# Patient Record
Sex: Female | Born: 1955 | Hispanic: Yes | Marital: Married | Smoking: Never smoker
Health system: Southern US, Community
[De-identification: ages and names within clinical notes are randomized; demographics above are authoritative.]

## PROBLEM LIST (undated history)

## (undated) DIAGNOSIS — N200 Calculus of kidney: Secondary | ICD-10-CM

## (undated) DIAGNOSIS — K259 Gastric ulcer, unspecified as acute or chronic, without hemorrhage or perforation: Secondary | ICD-10-CM

## (undated) DIAGNOSIS — N841 Polyp of cervix uteri: Secondary | ICD-10-CM

## (undated) HISTORY — DX: Gastric ulcer, unspecified as acute or chronic, without hemorrhage or perforation: K25.9

## (undated) HISTORY — DX: Calculus of kidney: N20.0

## (undated) HISTORY — PX: KIDNEY STONE SURGERY: SHX686

## (undated) HISTORY — DX: Polyp of cervix uteri: N84.1

---

## 2013-09-27 ENCOUNTER — Observation Stay: Payer: Self-pay | Admitting: Internal Medicine

## 2013-09-27 DIAGNOSIS — I059 Rheumatic mitral valve disease, unspecified: Secondary | ICD-10-CM

## 2013-09-27 LAB — CBC WITH DIFFERENTIAL/PLATELET
Basophil #: 0.1 10*3/uL (ref 0.0–0.1)
Basophil %: 0.8 %
EOS PCT: 2.9 %
Eosinophil #: 0.3 10*3/uL (ref 0.0–0.7)
HCT: 39.3 % (ref 35.0–47.0)
HGB: 14 g/dL (ref 12.0–16.0)
LYMPHS ABS: 4.8 10*3/uL — AB (ref 1.0–3.6)
Lymphocyte %: 43.7 %
MCH: 33.5 pg (ref 26.0–34.0)
MCHC: 35.7 g/dL (ref 32.0–36.0)
MCV: 94 fL (ref 80–100)
Monocyte #: 1 x10 3/mm — ABNORMAL HIGH (ref 0.2–0.9)
Monocyte %: 8.9 %
NEUTROS ABS: 4.8 10*3/uL (ref 1.4–6.5)
Neutrophil %: 43.7 %
PLATELETS: 257 10*3/uL (ref 150–440)
RBC: 4.19 10*6/uL (ref 3.80–5.20)
RDW: 13.2 % (ref 11.5–14.5)
WBC: 11.1 10*3/uL — ABNORMAL HIGH (ref 3.6–11.0)

## 2013-09-27 LAB — DRUG SCREEN, URINE

## 2013-09-27 LAB — COMPREHENSIVE METABOLIC PANEL
ALBUMIN: 3.5 g/dL (ref 3.4–5.0)
ALK PHOS: 100 U/L
ANION GAP: 2 — AB (ref 7–16)
AST: 38 U/L — AB (ref 15–37)
BUN: 30 mg/dL — ABNORMAL HIGH (ref 7–18)
Bilirubin,Total: 0.3 mg/dL (ref 0.2–1.0)
CALCIUM: 8.9 mg/dL (ref 8.5–10.1)
CHLORIDE: 107 mmol/L (ref 98–107)
CREATININE: 0.67 mg/dL (ref 0.60–1.30)
Co2: 29 mmol/L (ref 21–32)
EGFR (Non-African Amer.): >60
Glucose: 129 mg/dL — ABNORMAL HIGH (ref 65–99)
Osmolality: 284 (ref 275–301)
Potassium: 3.8 mmol/L (ref 3.5–5.1)
SGPT (ALT): 46 U/L (ref 12–78)
Sodium: 138 mmol/L (ref 136–145)
TOTAL PROTEIN: 7.4 g/dL (ref 6.4–8.2)

## 2013-09-27 LAB — TROPONIN I
Troponin-I: <0.02 ng/mL
Troponin-I: <0.02 ng/mL
Troponin-I: <0.02 ng/mL

## 2013-09-27 LAB — URINALYSIS, COMPLETE
BACTERIA: NONE SEEN
BILIRUBIN, UR: NEGATIVE
Blood: NEGATIVE
GLUCOSE, UR: NEGATIVE mg/dL (ref 0–75)
KETONE: NEGATIVE
Nitrite: NEGATIVE
PH: 5 (ref 4.5–8.0)
PROTEIN: NEGATIVE
SPECIFIC GRAVITY: 1.023 (ref 1.003–1.030)
Squamous Epithelial: 1
WBC UR: 4 /HPF (ref 0–5)

## 2013-09-27 LAB — CK TOTAL AND CKMB (NOT AT ARMC)
CK, Total: 95 U/L
CK, Total: 91 U/L
CK, Total: 87 U/L
CK-MB: 2.2 ng/mL (ref 0.5–3.6)
CK-MB: 2.1 ng/mL (ref 0.5–3.6)
CK-MB: 1.7 ng/mL (ref 0.5–3.6)

## 2013-09-27 LAB — LIPASE, BLOOD: LIPASE: 123 U/L (ref 73–393)

## 2013-09-28 LAB — LIPID PANEL
Cholesterol: 173 mg/dL (ref 0–200)
HDL: 26 mg/dL — AB (ref 40–60)
Ldl Cholesterol, Calc: 89 mg/dL (ref 0–100)
Triglycerides: 290 mg/dL — ABNORMAL HIGH (ref 0–200)
VLDL CHOLESTEROL, CALC: 58 mg/dL — AB (ref 5–40)

## 2014-12-04 NOTE — Discharge Summary (Signed)
PATIENT NAME:  Beth Copeland, Beth Copeland MR#:  [id number] DATE OF BIRTH:  1955/09/27  DATE OF ADMISSION:  09/27/2013 DATE OF DISCHARGE:  09/28/2013  ADMITTING PHYSICIAN:  Aaron Mose. Hower, MD  DISCHARGING PHYSICIAN: Gladstone Lighter, MD  PRIMARY CARE PHYSICIAN: None.   [place]: None.   DISCHARGE DIAGNOSES: 1.  Syncope, likely vasovagal or panic attack.  2.  Gastroesophageal reflux disease.  3.  Nocturnal cough secondary to gastroesophageal reflux disease.    DISCHARGE HOME MEDICATIONS:  1.  Protonix 40 mg p.o. at bedtime, before meals.  2.  Ranitidine 150 mg p.o. at bedtime.   DISCHARGE DIET:  Low fat diet.   DISCHARGE ACTIVITY: As tolerated.    FOLLOWUP INSTRUCTIONS:  PCP followup in 2 to 3 weeks.   LABORATORY, DIAGNOSTIC AND RADIOLOGICAL DATA PRIOR TO DISCHARGE:  1.  LDL cholesterol 89, HDL 26, triglycerides 290 and total cholesterol 173.  2.  CK, CK-MB, troponin is x 3 are negative.  3.  Ultrasound carotid Dopplers showing minimal amount of intimal wall thickening, no hemodynamically significant cyanosis noted. There is also a 2 cm cystic nodule in left lobe of thyroid.   4.  Echo Doppler showing normal LV ejection fraction, EF of 55% to 60%, impaired relaxation of LV diastolic filling noted.  5.  Urinalysis negative for any infection. Urine tox screen is negative.  6.  CT of the head without contrast showing no acute intracranial abnormalities noted.  7.  CT of the abdomen and pelvis with contrast showing no traumatic injury, chronic renal disease, diverticulosis of the left colon and hepatic cyst which is benign, and cardiomegaly noted.  8.  CT C-spine showing normal noncontrast CT of the C-spine.  9.  WBC 11.1, hemoglobin 14.3, hematocrit 39.3, platelet count 257.  10.  Sodium 138, potassium 3.8, chloride 107, bicarb 29, BUN 30, creatinine 0.67, glucose 129 and calcium of 8.9.  11.  ALT 46, AST 38, alk phos 100, total bili 0.3 and albumin 3.5. Lipase is 123.    HOSPITAL COURSE: Ms. Kaman is a 59 year old Spanish-speaking Hispanic female with no significant past medical history, presents after a syncopal episode.  1.  Syncope/near syncope. The patient suddenly saw a picture of her mom who just passed away, became emotional, hyperventilating and then had a syncopal episode so it is likely secondary to like a panic attack or vasovagal syncope. She was monitored on telemetry, showed only sinus brady with no arrhythmias. Had an echocardiogram which was completely normal and carotid ultrasound which did not show any obstructive lesions. The patient ambulated well without any recurrence of the symptoms and is being discharged home.  2.  Nocturnal cough with gastroesophageal reflux disease. The patient has severe heartburn symptoms and significant nocturnal dry cough, was started on Protonix and Zantac while in the hospital with improvement in symptoms and is being discharged on the same.  3.  Abdominal pain. She has abdominal pain which sounded more muscular, however, a CT of the abdomen and pelvis was done which was negative in the ED. Probably from her constant nocturnal cough that she has muscle spasms and the muscular abdominal is relieved at the time of discharge.   Her course has been otherwise uneventful in the hospital.   DISCHARGE CONDITION: Stable.   DISCHARGE DISPOSITION: Home.  TIME SPENT ON DISCHARGE:  40 minutes.   ____________________________ Gladstone Lighter, MD rk:cs D: 09/28/2013 13:42:29 ET T: 09/28/2013 14:11:11 ET JOB#: [id number]  cc: Gladstone Lighter, MD, <Dictator> Gladstone Lighter MD  ELECTRONICALLY SIGNED 09/29/2013 15:07

## 2014-12-04 NOTE — H&P (Signed)
PATIENT NAME:  Beth Copeland, FRIESENHAHN MR#:  [id number] DATE OF BIRTH:  1955/11/28  DATE OF ADMISSION:  09/26/2013  REFERRING PHYSICIAN:  Dr. Darnelle Catalan.   PRIMARY CARE PHYSICIAN:  Nonlocal.   CHIEF COMPLAINT:  Syncope.   HISTORY OF PRESENT ILLNESS:  A 59 year old Hispanic female without significant past medical history presenting after a syncopal episode.  Apparently, she was looking at a picture of her mother who is deceased, felt dizzy which she describes as the room spinning sensation and then sat down in the bathroom on the bathtub.  She denies any head trauma, though she states that she may have had possible loss of consciousness for a few moments.  She denies any chest pain, palpitations, shortness of breath, anginal symptoms, any preceding symptoms.  She has been in her usual state of health; however, on initial work-up she was found to have lateral T inversions on her EKG, been requested to see her for admission based off of the EKG findings.  The patient herself is currently complaining only of mild dizziness that is significantly improved.   REVIEW OF SYSTEMS:  CONSTITUTIONAL:  Denies fever.  Positive for generalized fatigue and weakness.  EYES:  Denies blurred vision, double vision, eye pain.  EARS, NOSE, THROAT:  Denies tinnitus, ear pain, hearing loss.  RESPIRATORY:  Denies cough, wheeze, shortness of breath.  CARDIOVASCULAR:  Denies chest pain, palpitations, edema. GASTROINTESTINAL:  Denies nausea, vomiting, diarrhea, abdominal pain.  GENITOURINARY:  Denies dysuria or hematuria.  ENDOCRINE:  Denies nocturia or thyroid problems.  HEMATOLOGY AND LYMPHATIC:  Denies easy bruising, bleeding.  SKIN:  Denies rash or lesion.  MUSCULOSKELETAL:  Denies pain in neck, back, shoulder, knees, hips or arthritic symptoms.  NEUROLOGIC:  Denies paralysis, paresthesias or headache.  Positive for dizziness described above.   PSYCHIATRIC:  Positive for depressive symptoms.  Denies any anxiety.    PAST MEDICAL  HISTORY:  No significant medical issues.   SOCIAL HISTORY:  Denies alcohol, tobacco or drug usage.   FAMILY HISTORY:  Denies any sudden cardiac death or cardiovascular diseases.   ALLERGIES:  No known drug allergies.   HOME MEDICATIONS:  None.   PHYSICAL EXAMINATION: VITAL SIGNS:  Temperature 98.4, heart rate 58, respirations 18, blood pressure 138/76, saturating 97% on room air.  Weight 68 kg.  GENERAL:  Well-nourished, well-developed Hispanic female who is currently in no acute distress.  HEAD:  Normocephalic, atraumatic.  EYES:  Pupils equal, round, reactive to light.  Extraocular muscles intact.  No scleral icterus.  MOUTH:  Moist mucosal membranes.  Dentition intact.  No abscess noted.  EAR, NOSE, THROAT:  Throat clear without exudates.  No external lesions.  NECK:  Supple.  No thyromegaly.  No nodules.  No JVD.  PULMONARY:  Clear to auscultation bilaterally without wheezes, rubs or rhonchi.  No use of accessory muscles.  Good respiratory rate.  CHEST:  Nontender to palpation.  CARDIOVASCULAR:  S1, S2, regular rate and rhythm.  No murmurs, rubs, or gallops.  No edema.  Pedal pulses 2+ bilaterally.  GASTROINTESTINAL:  Soft, nontender, nondistended.  No masses.  Positive bowel sounds.  No hepatosplenomegaly.  MUSCULOSKELETAL:  No swelling, clubbing, edema.  Range of motion full in all extremities.  NEUROLOGIC:  Cranial nerves II through XII intact.  No gross focal neurological deficits.  Sensation intact.  Reflexes intact.  SKIN:  No ulcerations, lesions, rashes, cyanosis.  Skin warm, dry.  Turgor intact.  PSYCHIATRIC:  Mood and affect appears depressed.  She is awake, alert,  oriented x 3, though responses are somewhat slow.  Insight and judgment are intact.   LABORATORY DATA:  EKG performed revealing sinus bradycardia, heart rate 54.  No significant conduction delays, likely left atrial enlargement as well as lateral T inversions.  No old EKG for comparison.  Remainder of laboratory  data, sodium 138, potassium 3.8, chloride 107, bicarb 29, BUN 30, creatinine 0.67, glucose 129.  LFTs within normal limits.  Troponin I less than 0.02.  Urine drug screen within normal limits.  WBC 11.1, hemoglobin 14, platelets 257.  Urinalysis, no evidence of infection.   ASSESSMENT AND PLAN:  A 59 year old Hispanic female without significant past medical history presenting after a brief syncopal.  1.  Syncope.  EKG reveals lateral T inversions.  We will admit to observation.  Trend cardiac enzymes.  Admit to telemetry.  She has no other cardiac history or symptoms.  2.  Leukocytosis.  No evidence of infection.  No need for antibiotics at this time.  3.  VTE prophylaxis with heparin subQ.  4.  CODE STATUS:  THE PATIENT IS A FULL CODE.   TIME SPENT:  45 minutes.    ____________________________ Cletis Athensavid K. Hower, MD dkh:ea D: 09/27/2013 03:00:30 ET T: 09/27/2013 04:18:14 ET JOB#: [id number]  cc: Cletis Athensavid K. Hower, MD, <Dictator> DAVID Synetta ShadowK HOWER MD ELECTRONICALLY SIGNED 09/27/2013 20:24

## 2015-04-23 IMAGING — CT CT HEAD WITHOUT CONTRAST
3 of 5 series · 14 of 47 positions shown, 16 images · non-contrast
Comparison: None.

CLINICAL DATA: 57-year-old female dizziness fall. Neck pain.
Initial encounter.

EXAM:
CT HEAD WITHOUT CONTRAST
CT CERVICAL SPINE WITHOUT CONTRAST
TECHNIQUE: Multidetector CT imaging of the head and cervical spine was
performed following the standard protocol without intravenous
contrast. Multiplanar CT image reconstructions of the cervical spine
were also generated.

[Series 5: soft tissue · axial · 0.29mm/px · z∈[-286,-142]mm · 8 of 88 slices shown, 10 images]
[im 8/88  brain]
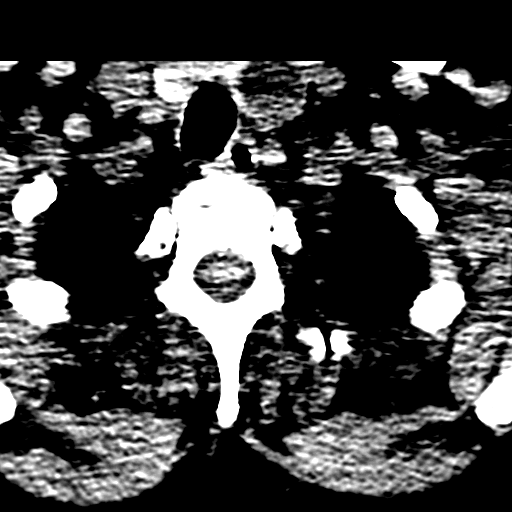
[im 8/88  bone]
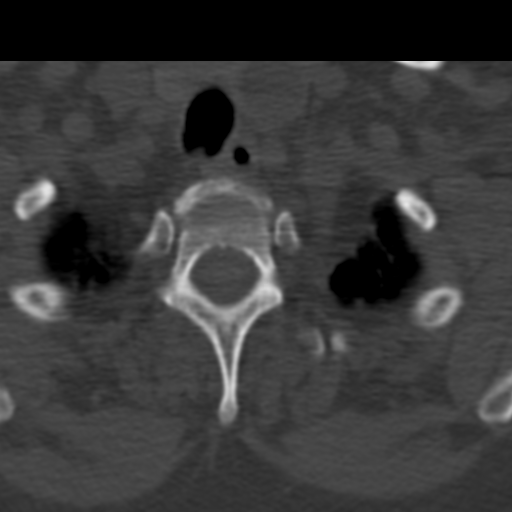
[im 22/88  brain]
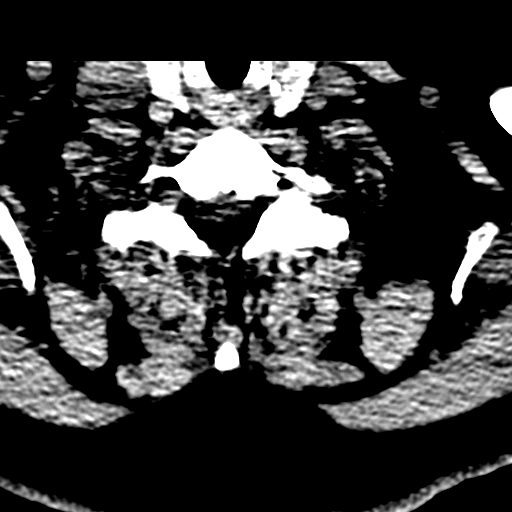
[im 30/88  brain]
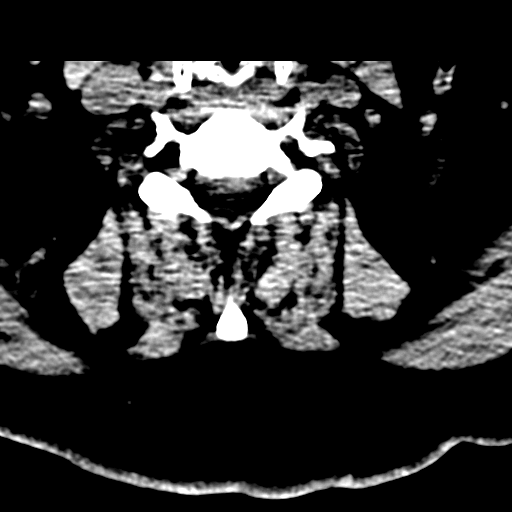
[im 37/88  brain]
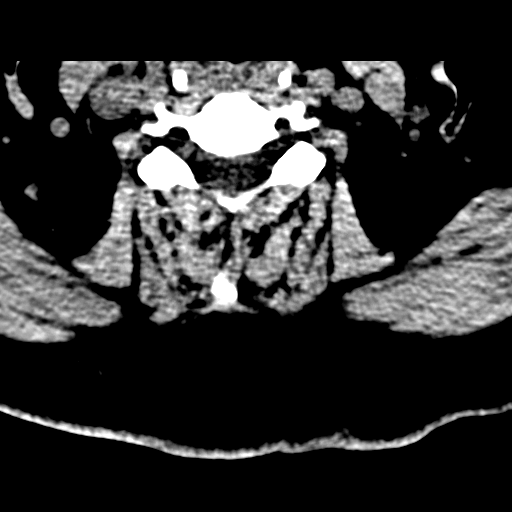
[im 51/88  brain]
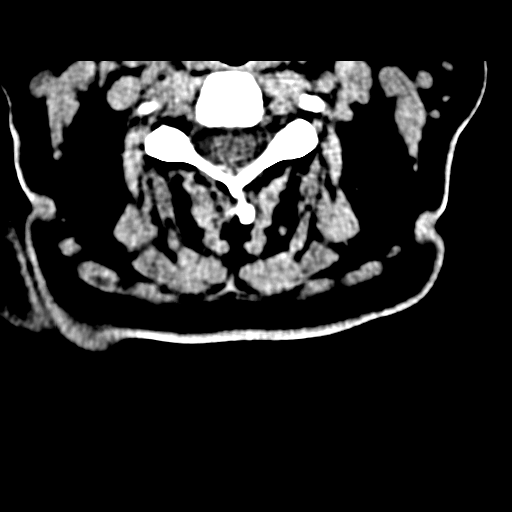
[im 51/88  bone]
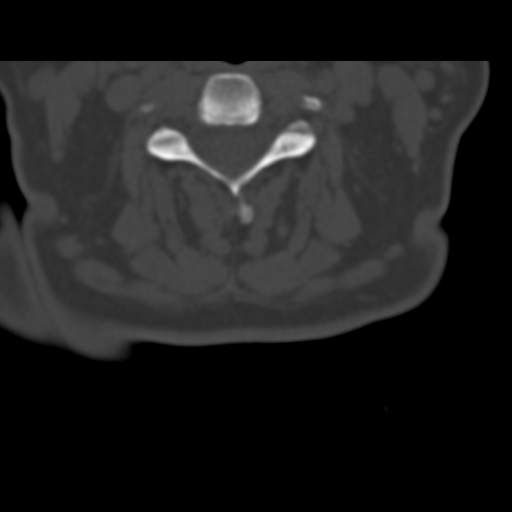
[im 59/88  brain]
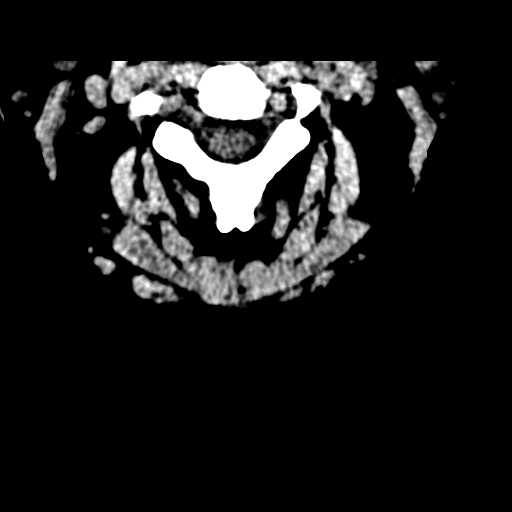
[im 66/88  brain]
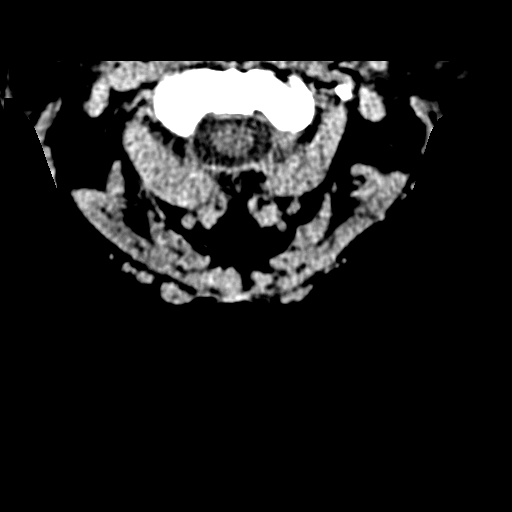
[im 80/88  brain]
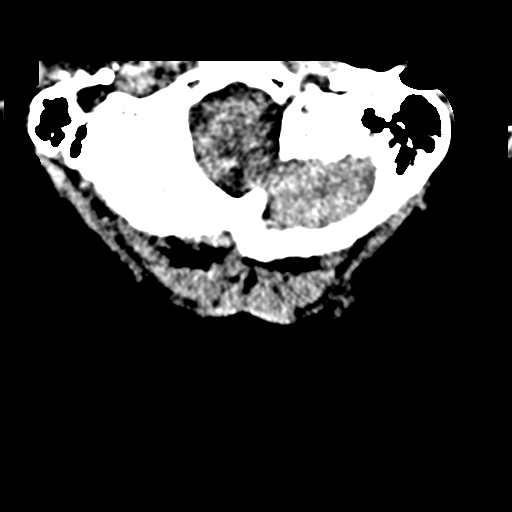

[Series 8: sagittal bone · sagittal · 0.21mm/px · 3 of 54 slices shown]
[im 18/54  brain]
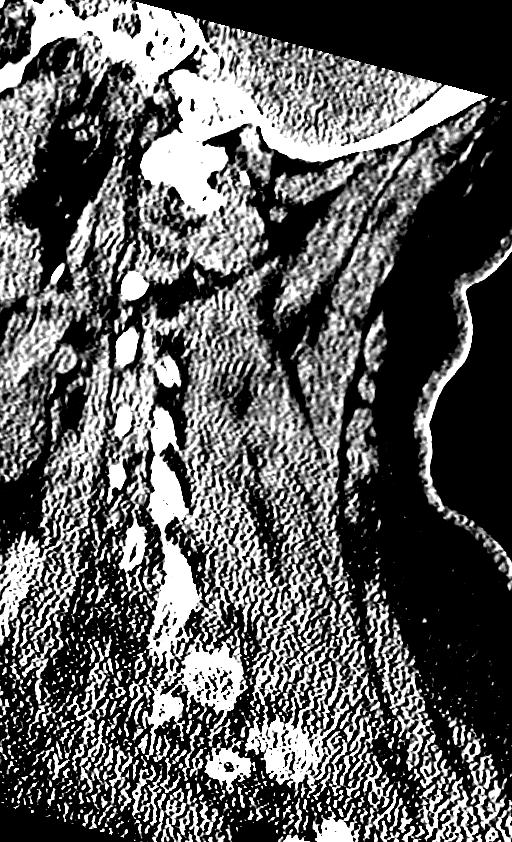
[im 27/54  brain]
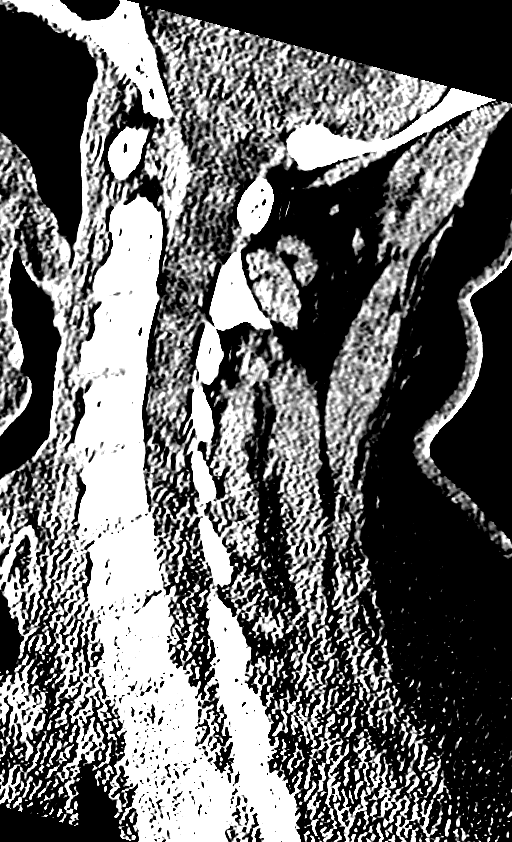
[im 36/54  brain]
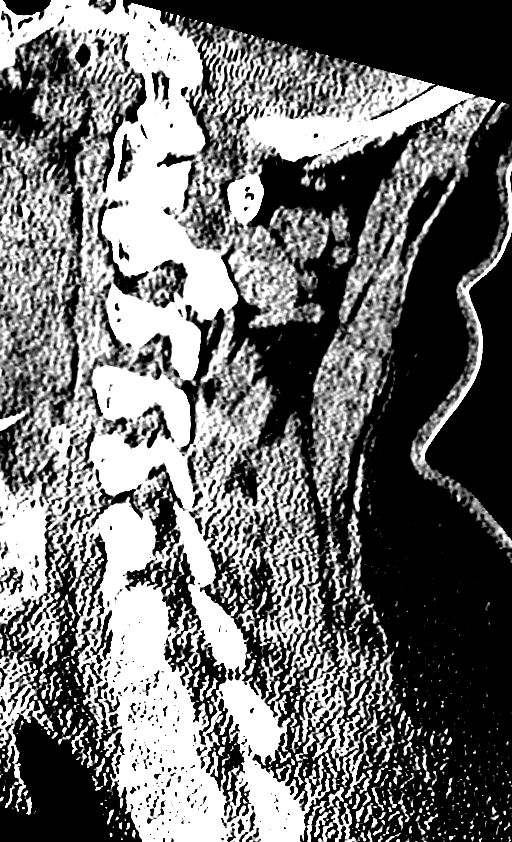

[Series 9: coronal bone · coronal · 0.28mm/px · 3 of 41 slices shown]
[im 14/41  brain]
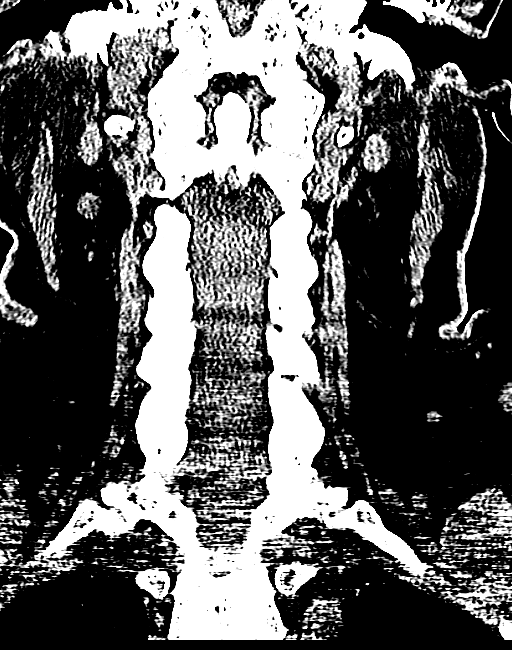
[im 18/41  brain]
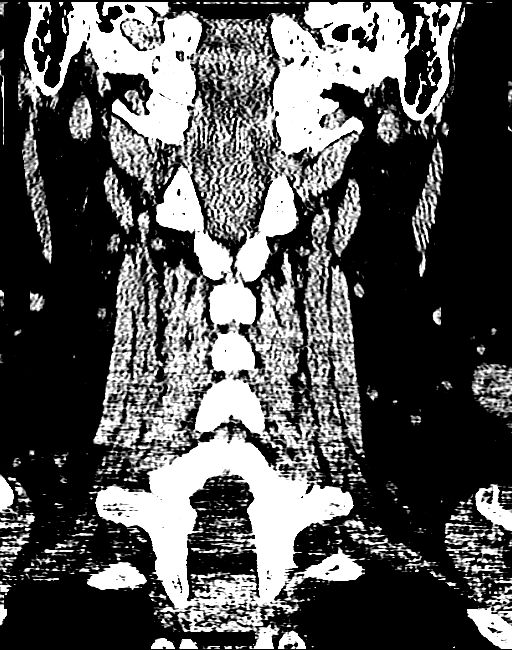
[im 23/41  brain]
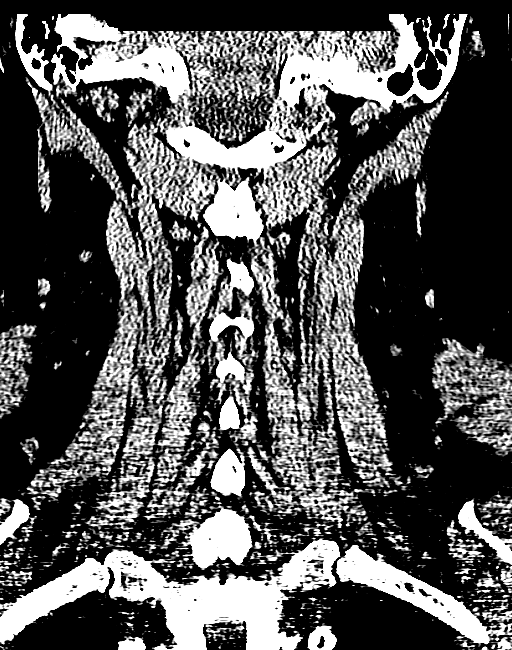

[14 of 47 positions shown; findings below may reference images not displayed]

FINDINGS: CT HEAD FINDINGS

Visualized orbits and scalp soft tissues are within normal limits.
Visualized paranasal sinuses and mastoids are clear. No acute
osseous abnormality identified.

Cerebral volume is normal. No midline shift, ventriculomegaly, mass
effect, evidence of mass lesion, intracranial hemorrhage or evidence
of cortically based acute infarction. Gray-white matter
differentiation is within normal limits throughout the brain.

CT CERVICAL SPINE FINDINGS

Relatively preserved cervical lordosis. Cervicothoracic junction
alignment is within normal limits. Visualized skull base is intact.
No atlanto-occipital dissociation. Bilateral posterior element
alignment is within normal limits. No acute cervical spine fracture.
Grossly intact visible upper thoracic levels. Mild atelectasis in
the lung apices. 2 cm left thyroid low density nodule. Otherwise
negative paraspinal soft tissues.
IMPRESSION: 1.  Normal noncontrast CT appearance of the brain.
2. No acute fracture or listhesis identified in the cervical spine.
Ligamentous injury is not excluded.
3. 2 cm low-density left thyroid nodule, recommend nonemergent
thyroid ultrasound for further characterization.

## 2016-06-13 ENCOUNTER — Other Ambulatory Visit: Payer: Self-pay | Admitting: *Deleted

## 2016-06-13 ENCOUNTER — Ambulatory Visit
Admission: RE | Admit: 2016-06-13 | Discharge: 2016-06-13 | Disposition: A | Discharge status: Home / Self Care | Payer: Self-pay | Source: Ambulatory Visit | Attending: Oncology | Admitting: Oncology

## 2016-06-13 ENCOUNTER — Ambulatory Visit: Payer: Self-pay | Attending: Oncology | Admitting: *Deleted

## 2016-06-13 ENCOUNTER — Encounter: Payer: Self-pay | Admitting: *Deleted

## 2016-06-13 VITALS — BP 153/79 | HR 54 | Temp 97.0°F | Ht 63.78 in | Wt 175.4 lb

## 2016-06-13 DIAGNOSIS — N63 Unspecified lump in unspecified breast: Secondary | ICD-10-CM

## 2016-06-13 DIAGNOSIS — Z0000 Encounter for general adult medical examination without abnormal findings: Secondary | ICD-10-CM

## 2016-06-13 NOTE — Progress Notes (Signed)
Subjective:     Patient ID: Beth Copeland, female   DOB: 1956/08/11, 60 y.o.   MRN: [id number]  HPI   Review of Systems     Objective:   Physical Exam  Pulmonary/Chest: Right breast exhibits no inverted nipple, no mass, no nipple discharge, no skin change and no tenderness. Left breast exhibits no inverted nipple, no mass, no nipple discharge, no skin change and no tenderness. Breasts are symmetrical.  Abdominal: There is no splenomegaly or hepatomegaly.  Genitourinary: No labial fusion. There is no rash, tenderness, lesion or injury on the right labia. There is no rash, tenderness, lesion or injury on the left labia. No erythema, tenderness or bleeding in the vagina. No foreign body in the vagina. No signs of injury around the vagina. No vaginal discharge found.    Genitourinary Comments: Large cystocele noted       Assessment:     60 year old Hispanic [facility], pap and mammogram.  Beth Copeland, the interpreter present during the interview and exam.  Clinical breast exam unremarkable.  Taught self breast awareness.  Specimen collected for pap smear.  On visual inspection there is noted excoriation around the cervical os and 2 polyp like lesions.  Patient has been screened for eligibility.  She does not have any insurance, Medicare or Medicaid.  She also meets financial eligibility.  Hand-out given on the Affordable Care Act.    Plan:     Will get screening mammogram.  This will be the patients baseline mammogram since she has never had one.  Specimen sent to the lab.  Beth Copeland to schedule patient for GYN consult for further evaluation of the cervical polyps.

## 2016-06-13 NOTE — Patient Instructions (Signed)
Human Papillomavirus Human papillomavirus (HPV) is the most common sexually transmitted infection (STI) and is highly contagious. HPV infections cause genital warts and cancers to the outlet of the womb (cervix), birth canal (vagina), opening of the birth canal (vulva), and anus. There are over 100 types of HPV. Unless wartlike lesions are present in the throat or there are genital warts that you can see or feel, HPV usually does not cause symptoms. It is possible to be infected for long periods and pass it on to others without knowing it. CAUSES  HPV is spread from person to person through sexual contact. This includes oral, vaginal, or anal sex. RISK FACTORS  Having unprotected sex. HPV can be spread by oral, vaginal, or anal sex.  Having several sex partners.  Having a sex partner who has other sex partners.  Having or having had another sexually transmitted infection. SIGNS AND SYMPTOMS  Most people carrying HPV do not have any symptoms. If symptoms are present, symptoms may include:  Wartlike lesions in the throat (from having oral sex).  Warts in the infected skin or mucous membranes.  Genital warts that may itch, burn, or bleed.  Genital warts that may be painful or bleed during sexual intercourse. DIAGNOSIS  If wartlike lesions are present in the throat or genital warts are present, your health care provider can usually diagnose HPV by physical examination.   Genital warts are easily seen with the naked eye.  Currently, there is no FDA-approved test to detect HPV in males.  In females, a Pap test can show cells that are infected with HPV.  In females, a scope can be used to view the cervix (colposcopy). A colposcopy can be performed if the pelvic exam or Pap test is abnormal. A sample of tissue may be removed (biopsy) during the colposcopy. TREATMENT  There is no treatment for the virus itself. However, there are treatments for the health problems and symptoms HPV can cause.  Your health care provider will follow you closely after you are treated. This is because the HPV can come back and may need treatment again. Treatment of HPV may include:   Medicines, which may be injected or applied in a cream, lotion, or gel form.  Use of a probe to apply extreme cold (cryotherapy).  Application of an intense beam of light (laser treatment).  Use of a probe to apply extreme heat (electrocautery).  Surgery. HOME CARE INSTRUCTIONS   Take medicines only as directed by your health care provider.  Use over-the-counter creams for itching or irritation as directed by your health care provider.  Keep all follow-up visits as directed by your health care provider. This is important.  Do not touch or scratch the warts.  Do not treat genital warts with medicines used for treating hand warts.  Do not have sex while you are being treated.  Do not douche or use tampons during treatment of HPV.  Tell your sex partner about your infection because he or she may also need treatment.  If you become pregnant, tell your health care provider that you have had HPV. Your health care provider will monitor you closely during pregnancy to be sure your baby is safe.  After treatment, use condoms during sex to prevent future infections.  Have only one sex partner.  Have a sex partner who does not have other sex partners. PREVENTION   Talk to your health care provider about getting the HPV vaccines. These vaccines prevent some HPV infections and cancers.   It is recommended that the vaccine be given to males and females between the ages of 9 and 26 years old. It will not work if you already have HPV, and it is not recommended for pregnant women.  A Pap test is done to screen for cervical cancer in women.  The first Pap test should be done at age 21 years.  Between ages 21 and 29 years, Pap tests are repeated every 2 years.  Beginning at age 30, you are advised to have a Pap test every  3 years as long as your past 3 Pap tests have been normal.  Some women have medical problems that increase the chance of getting cervical cancer. Talk to your health care provider about these problems. It is especially important to talk to your health care provider if a new problem develops soon after your last Pap test. In these cases, your health care provider may recommend more frequent screening and Pap tests.  The above recommendations are the same for women who have or have not gotten the vaccine for HPV.  If you had a hysterectomy for a problem that was not a cancer or a condition that could lead to cancer, then you no longer need Pap tests. However, even if you no longer need a Pap test, a regular exam is a good idea to make sure no other problems are starting.   If you are between the ages of 65 and 70 years and you have had normal Pap tests going back 10 years, you no longer need Pap tests. However, even if you no longer need a Pap test, a regular exam is a good idea to make sure no other problems are starting.  If you have had past treatment for cervical cancer or a condition that could lead to cancer, you need Pap tests and screening for cancer for at least 20 years after your treatment.  If Pap tests have been discontinued, risk factors (such as a new sexual partner)need to be reassessed to determine if screening should be resumed.  Some women may need screenings more often if they are at high risk for cervical cancer. SEEK MEDICAL CARE IF:   The treated skin becomes red, swollen, or painful.  You have a fever.  You feel generally ill.  You feel lumps or pimple-like projections in and around your genital area.  You develop bleeding of the vagina or the treatment area.  You have painful sexual intercourse. MAKE SURE YOU:   Understand these instructions.  Will watch your condition.  Will get help if you are not doing well or get worse.   This information is not  intended to replace advice given to you by your health care provider. Make sure you discuss any questions you have with your health care provider.   Document Released: 10/20/2003 Document Revised: 08/20/2014 Document Reviewed: 11/04/2013 Elsevier Interactive Patient Education 2016 Elsevier Inc.   Gave patient hand-out, Women Staying Healthy, Active and Well from [facility], with education on breast health, pap smears, heart and colon health. 

## 2016-06-15 ENCOUNTER — Telehealth: Payer: Self-pay | Admitting: *Deleted

## 2016-06-18 LAB — PAP LB AND HPV HIGH-RISK
HPV, high-risk: NEGATIVE
PAP Smear Comment: 0

## 2016-07-10 ENCOUNTER — Telehealth: Payer: Self-pay | Admitting: *Deleted

## 2016-07-13 ENCOUNTER — Telehealth: Payer: Self-pay | Admitting: *Deleted

## 2016-07-16 ENCOUNTER — Encounter: Payer: Self-pay | Admitting: *Deleted

## 2016-07-16 NOTE — Progress Notes (Signed)
We have been unable to reach the patient via phone.  Certified letter translated by Kandis CockingMaritza the interpreter.  Patient needs additional imaging and gyn consult.  Certified letter mailed today.

## 2016-08-14 ENCOUNTER — Encounter: Payer: Self-pay | Admitting: *Deleted

## 2016-08-14 NOTE — Progress Notes (Signed)
I have had no response to my certified letter or phone calls in almost one month.  Will close case as refusal to follow-up.  [facility]IS to [place].

## 2016-08-22 ENCOUNTER — Telehealth: Payer: Self-pay | Admitting: *Deleted

## 2016-09-05 ENCOUNTER — Ambulatory Visit: Payer: Self-pay

## 2016-09-05 ENCOUNTER — Ambulatory Visit: Payer: Self-pay | Attending: Oncology

## 2016-09-06 ENCOUNTER — Encounter: Payer: Self-pay | Admitting: *Deleted

## 2016-09-06 NOTE — Progress Notes (Signed)
Patient no-showed for mammogram appointment on 09/05/16 and has not rescheduled.

## 2016-09-13 ENCOUNTER — Other Ambulatory Visit: Payer: Self-pay | Admitting: Obstetrics and Gynecology

## 2016-09-13 ENCOUNTER — Ambulatory Visit (INDEPENDENT_AMBULATORY_CARE_PROVIDER_SITE_OTHER): Payer: BLUE CROSS/BLUE SHIELD | Admitting: Obstetrics and Gynecology

## 2016-09-13 ENCOUNTER — Encounter: Payer: Self-pay | Admitting: Obstetrics and Gynecology

## 2016-09-13 VITALS — BP 137/78 | HR 59 | Wt 183.6 lb

## 2016-09-13 DIAGNOSIS — Z87442 Personal history of urinary calculi: Secondary | ICD-10-CM

## 2016-09-13 DIAGNOSIS — N841 Polyp of cervix uteri: Secondary | ICD-10-CM | POA: Insufficient documentation

## 2016-09-13 DIAGNOSIS — N8111 Cystocele, midline: Secondary | ICD-10-CM

## 2016-09-13 DIAGNOSIS — Z780 Asymptomatic menopausal state: Secondary | ICD-10-CM | POA: Diagnosis not present

## 2016-09-13 NOTE — Patient Instructions (Signed)
Biopsia cervicouterina, cuidados posteriores (Cervical Biopsy, Care After) Siga estas instrucciones durante las prximas semanas. Estas indicaciones le proporcionan informacin acerca de cmo deber cuidarse despus del procedimiento. El mdico tambin podr darle instrucciones ms especficas. El tratamiento ha sido planificado segn las prcticas mdicas actuales, pero en algunos casos pueden ocurrir problemas. Comunquese con el mdico si tiene algn problema o dudas despus del procedimiento. QU ESPERAR DESPUS DEL PROCEDIMIENTO Despus del procedimiento, es comn DIRECTVtener los siguientes sntomas:  [place] abdominales o dolor leve durante [address].  Hemorragia vaginal leve durante algunos das.  Secrecin vaginal oscura durante [employer] Warner[employer]gunos das. INSTRUCCIONES PARA EL CUIDADO EN EL HOGAR  Tome los medicamentos de venta libre y los recetados solamente como se lo haya indicado el mdico.  Reanude sus actividades normales como se lo haya indicado el mdico. Pregntele al mdico qu actividades son seguras para usted.  Use una compresa higinica hasta que la hemorragia y la secrecin se detengan.  No use tampones hasta que el mdico la autorice.  No se haga duchas vaginales hasta que el mdico la autorice.  No tenga relaciones sexuales hasta que el mdico la autorice.  Concurra a todas las visitas de control como se lo haya indicado el mdico. Esto es importante. SOLICITE ATENCIN MDICA SI:  Tiene fiebre o siente escalofros.  Tiene secrecin vaginal con mal olor.  Tiene picazn o irritacin alrededor de la vagina.  Siente dolor en la parte inferior del abdomen. SOLICITE ATENCIN MDICA DE INMEDIATO SI:  Tiene hemorragia vaginal abundante que empapa ms de una compresa higinica por hora.  Se desmaya.  Siente un dolor muy intenso en la parte inferior del abdomen. Esta informacin no tiene Theme park managercomo fin reemplazar el consejo del mdico. Asegrese de hacerle al mdico cualquier  pregunta que tenga. Document Released: 04/20/2015 Document Revised: 04/20/2015 Document Reviewed: 12/15/2014 Elsevier Interactive Patient Education  2017 [employer]

## 2016-09-13 NOTE — Progress Notes (Signed)
GYN ENCOUNTER NOTE  Subjective:       Beth Copeland is a 61 y.o. [phone] female is here for gynecologic evaluation of the following issues:  1. Endocervical polyp  [facility] evaluation of an asymptomatic endocervical polyp. Patient has no history of abnormal Pap smears. Patient is menopausal not on hormone replacement therapy. Patient denies vaginal bleeding or vaginal discharge. She is not experiencing significant pelvic pain   Gynecologic History No LMP recorded. Patient is postmenopausal. Contraception: post menopausal status Last Pap: No history of abnormal Pap smears   Obstetric History OB History  Gravida Para Term Preterm AB Living  3 3 3     3   SAB TAB Ectopic Multiple Live Births          3    # Outcome Date GA Lbr Len/2nd Weight Sex Delivery Anes PTL Lv  3 Term 351998    F Vag-Spont   LIV  2 Term 881981    M Vag-Spont   LIV  1 Term 731977    M Vag-Spont   LIV      Past Medical History:  Diagnosis Date  . Cervical polyp   . Kidney stones   . Stomach ulcer     Past Surgical History:  Procedure Laterality Date  . KIDNEY STONE SURGERY      No current outpatient prescriptions on file prior to visit.   No current facility-administered medications on file prior to visit.     No Known Allergies  Social History   Social History  . Marital status: Married    Spouse name: N/A  . Number of children: N/A  . Years of education: N/A   Occupational History  . Not on file.   Social History Main Topics  . Smoking status: Never Smoker  . Smokeless tobacco: Never Used  . Alcohol use No  . Drug use: No  . Sexual activity: Yes    Birth control/ protection: Post-menopausal   Other Topics Concern  . Not on file   Social History Narrative  . No narrative on file    Family History  Problem Relation Age of Onset  . Cancer Neg Hx   . Heart disease Neg Hx   . Diabetes Neg Hx     The following portions of the  patient's history were reviewed and updated as appropriate: allergies, current medications, past family history, past medical history, past social history, past surgical history and problem list.  Review of Systems Review of Systems - Comprehensive review of systems is negative except for that noted in history of present illness  Objective:   BP 137/78   Pulse (!) 59   Wt 183 lb 9.6 oz (83.3 kg)   BMI 31.73 kg/m  CONSTITUTIONAL: Well-developed, well-nourished female in no acute distress.  HENT:  Normocephalic, atraumatic.  NECK: Normal range of motion, supple, no masses.  Normal thyroid.  SKIN: Skin is warm and dry. No rash noted. Not diaphoretic. No erythema. No pallor. NEUROLGIC: Alert and oriented to person, place, and time. PSYCHIATRIC: Normal mood and affect. Normal behavior. Normal judgment and thought content. CARDIOVASCULAR:Not Examined RESPIRATORY: Not Examined BREASTS: Not Examined ABDOMEN: Soft, non distended; Non tender.  No Organomegaly. PELVIC:  External Genitalia: Normal  BUS: Normal  Vagina: Normal vaginal mucosa; second-degree cystocele  Cervix: Parous; endocervical polyp and 9:00 (removed); nabothian   cyst at 6:00   Uterus: not examined   Adnexa:  Not examined  RV: Normal  external exam  Bladder: Nontender MUSCULOSKELETAL: not examined  PROCEDURE: Cervical biopsy Verbal consent is obtained. Speculum was placed in the vagina and the endocervical polyp is visualized. Tischler forceps is used to remove the endocervical polyp. Monsel's solution is applied for hemostasis. Estimated blood loss is minimal. Complications are none. Procedure was well-tolerated. Postprocedure instructions were given.   Assessment:   1. Endocervical polyp, Asymptomatic  2. Menopause  3. History of kidney stones  4. Cystocele, second degree     Plan:   1. Cervical polyp removal 2. Patient will be notified through my chart of results 3. Follow-up as needed for any future  gynecologic issues  Herold Harms, MD  Note: This dictation was prepared with Dragon dictation along with smaller phrase technology. Any transcriptional errors that result from this process are unintentional.

## 2016-09-19 LAB — PATHOLOGY

## 2016-11-07 ENCOUNTER — Encounter: Payer: Self-pay | Admitting: *Deleted

## 2016-11-07 NOTE — Progress Notes (Signed)
Patient with benign polyp.  Next pap due in 5 years.  Patient never returned for her additional images.  Since a certified letter had been sent will go ahead and close her case.
